# Patient Record
Sex: Female | Born: 1980 | Race: Black or African American | Hispanic: No | Marital: Single | Smoking: Never smoker
Health system: Southern US, Community
[De-identification: ages and names within clinical notes are randomized; demographics above are authoritative.]

## PROBLEM LIST (undated history)

## (undated) ENCOUNTER — Ambulatory Visit: Admission: EM | Payer: Non-veteran care

## (undated) HISTORY — PX: TUBAL LIGATION: SHX77

## (undated) HISTORY — PX: ABLATION: SHX5711

---

## 2015-01-27 DIAGNOSIS — J343 Hypertrophy of nasal turbinates: Secondary | ICD-10-CM | POA: Insufficient documentation

## 2015-01-27 DIAGNOSIS — Z91040 Latex allergy status: Secondary | ICD-10-CM | POA: Insufficient documentation

## 2015-01-27 DIAGNOSIS — J3489 Other specified disorders of nose and nasal sinuses: Secondary | ICD-10-CM | POA: Diagnosis present

## 2015-01-28 ENCOUNTER — Encounter (HOSPITAL_COMMUNITY): Payer: Self-pay | Admitting: Emergency Medicine

## 2015-01-28 ENCOUNTER — Emergency Department (HOSPITAL_COMMUNITY)
Admission: EM | Admit: 2015-01-28 | Discharge: 2015-01-28 | Disposition: A | Discharge status: Home / Self Care | Payer: Non-veteran care | Attending: Emergency Medicine | Admitting: Emergency Medicine

## 2015-01-28 DIAGNOSIS — J343 Hypertrophy of nasal turbinates: Secondary | ICD-10-CM

## 2015-01-28 NOTE — ED Notes (Signed)
Pt reports noticing feeling of something in R nares.  Airway patent, resp e/u.

## 2015-01-28 NOTE — ED Provider Notes (Signed)
CSN: [id number]     Arrival date & time 01/27/15  2345 History   First MD Initiated Contact with Patient 01/28/15 0013     Chief Complaint  Patient presents with  . Foreign Body in Nose     (Consider location/radiation/quality/duration/timing/severity/associated sxs/prior Treatment) HPI   Pt p/w concern for foreign body in her right nostril.  States she has noticed something in her right nostril for the past month, felt that she just needed to blow her nose to get it out.  Today she looked in her nose with a flashlight and noticed a piece of something "hanging down."  Described it as a "long piece of skin."  She attempted to pull it out or move it with a Qtip and by blowing her nose without success.  Denies fevers, increased nasal drainage, epistaxis, sinus pressure, headaches, sore throat, postnasal drip, any nasal trauma.  It is not painful.  She does have occasional clear drainage with blowing her nose.    History reviewed. No pertinent past medical history. Past Surgical History  Procedure Laterality Date  . Tubal ligation     No family history on file. Social History  Substance Use Topics  . Smoking status: Never Smoker   . Smokeless tobacco: None  . Alcohol Use: Yes     Comment: occassionally   OB History    No data available     Review of Systems  Constitutional: Negative for fever and chills.  HENT: Negative for congestion, dental problem, ear pain, facial swelling, mouth sores, nosebleeds, postnasal drip, rhinorrhea, sinus pressure, sneezing, sore throat and trouble swallowing.   Respiratory: Negative for cough, shortness of breath, wheezing and stridor.   Cardiovascular: Negative for chest pain.  Musculoskeletal: Negative for neck pain and neck stiffness.  Skin: Negative for wound.  Allergic/Immunologic: Negative for immunocompromised state.  Psychiatric/Behavioral: Negative for self-injury.      Allergies  Latex  Home Medications   Prior to Admission  medications   Not on File   BP 146/93 mmHg  Pulse 75  Temp(Src) 97.9 F (36.6 C) (Oral)  Resp 18  SpO2 100%  LMP 01/16/2015 (Approximate) Physical Exam  Constitutional: She appears well-developed and well-nourished. No distress.  HENT:  Head: Normocephalic and atraumatic.  Nose: No rhinorrhea, sinus tenderness, septal deviation or nasal septal hematoma. No epistaxis.  No foreign bodies. Right sinus exhibits no maxillary sinus tenderness and no frontal sinus tenderness. Left sinus exhibits no maxillary sinus tenderness and no frontal sinus tenderness.  Mouth/Throat: Oropharynx is clear and moist. No oropharyngeal exudate.  Right nasal mucosa with flap with appearance of nasal turbinate/mucosa that moves up and down with airflow through the nose.  It is not erythematous, edematous.  No tenderness within the nares.    Eyes: Conjunctivae are normal.  Neck: Neck supple.  Pulmonary/Chest: Effort normal.  Neurological: She is alert.  Skin: She is not diaphoretic.  Nursing note and vitals reviewed.   ED Course  Procedures (including critical care time) Labs Review Labs Reviewed - No data to display  Imaging Review No results found. I have personally reviewed and evaluated these images and lab results as part of my medical decision-making.   EKG Interpretation None      MDM   Final diagnoses:  Nasal turbinate hypertrophy    Afebrile, nontoxic patient with nasal turbinate hypertrophy vs skin flap on the lateral aspect of the right naris.  Does not appear infected.  No sick symptoms and no change in the sensation  she has had x 1 month.  Came to ED because she saw the skin flap and felt it was not normal.   I doubt any emergency medical condition regarding this flap that requires further evaluation or consultation with ENT tonight.  D/C home with outpatient ENT follow up.  Discussed result, findings, treatment, and follow up  with patient.  Pt given return precautions.  Pt verbalizes  understanding and agrees with plan.           [place], PA-C 01/28/15 1308  Tomasita Crumble, MD 01/28/15 [phone]

## 2015-01-28 NOTE — Discharge Instructions (Signed)
Read the information below.  You may return to the Emergency Department at any time for worsening condition or any new symptoms that concern you.  If you develop fevers, facial pain, uncontrolled nasal bleeding, return to the ER for a recheck.

## 2017-05-24 ENCOUNTER — Emergency Department (HOSPITAL_COMMUNITY)
Admission: EM | Admit: 2017-05-24 | Discharge: 2017-05-24 | Disposition: A | Discharge status: Home / Self Care | Payer: 59 | Attending: Emergency Medicine | Admitting: Emergency Medicine

## 2017-05-24 ENCOUNTER — Other Ambulatory Visit: Payer: Self-pay

## 2017-05-24 ENCOUNTER — Emergency Department (HOSPITAL_COMMUNITY): Payer: 59

## 2017-05-24 DIAGNOSIS — S0990XA Unspecified injury of head, initial encounter: Secondary | ICD-10-CM | POA: Diagnosis not present

## 2017-05-24 DIAGNOSIS — R51 Headache: Secondary | ICD-10-CM | POA: Diagnosis present

## 2017-05-24 DIAGNOSIS — F0781 Postconcussional syndrome: Secondary | ICD-10-CM | POA: Insufficient documentation

## 2017-05-24 DIAGNOSIS — H538 Other visual disturbances: Secondary | ICD-10-CM | POA: Diagnosis not present

## 2017-05-24 DIAGNOSIS — Y9289 Other specified places as the place of occurrence of the external cause: Secondary | ICD-10-CM | POA: Diagnosis not present

## 2017-05-24 DIAGNOSIS — G44309 Post-traumatic headache, unspecified, not intractable: Secondary | ICD-10-CM | POA: Diagnosis not present

## 2017-05-24 DIAGNOSIS — Y9389 Activity, other specified: Secondary | ICD-10-CM | POA: Diagnosis not present

## 2017-05-24 DIAGNOSIS — Y999 Unspecified external cause status: Secondary | ICD-10-CM | POA: Diagnosis not present

## 2017-05-24 DIAGNOSIS — R519 Headache, unspecified: Secondary | ICD-10-CM

## 2017-05-24 MED ORDER — ACETAMINOPHEN 500 MG PO TABS: 500.0000 mg | ORAL_TABLET | Freq: Four times a day (QID) | ORAL | 0 refills | Status: DC | PRN
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
## 2021-05-25 ENCOUNTER — Other Ambulatory Visit: Payer: Self-pay | Admitting: Obstetrics and Gynecology

## 2021-05-25 DIAGNOSIS — R928 Other abnormal and inconclusive findings on diagnostic imaging of breast: Secondary | ICD-10-CM

## 2021-07-12 ENCOUNTER — Emergency Department (HOSPITAL_COMMUNITY): Payer: BC Managed Care – PPO

## 2021-07-12 ENCOUNTER — Encounter (HOSPITAL_COMMUNITY): Payer: Self-pay | Admitting: Emergency Medicine

## 2021-07-12 ENCOUNTER — Other Ambulatory Visit: Payer: Self-pay

## 2021-07-12 ENCOUNTER — Emergency Department (HOSPITAL_COMMUNITY)
Admission: EM | Admit: 2021-07-12 | Discharge: 2021-07-12 | Disposition: A | Discharge status: Home / Self Care | Payer: BC Managed Care – PPO | Attending: Emergency Medicine | Admitting: Emergency Medicine

## 2021-07-12 DIAGNOSIS — R000 Tachycardia, unspecified: Secondary | ICD-10-CM | POA: Diagnosis not present

## 2021-07-12 DIAGNOSIS — Z91040 Latex allergy status: Secondary | ICD-10-CM | POA: Insufficient documentation

## 2021-07-12 DIAGNOSIS — N3001 Acute cystitis with hematuria: Secondary | ICD-10-CM | POA: Insufficient documentation

## 2021-07-12 DIAGNOSIS — Z79899 Other long term (current) drug therapy: Secondary | ICD-10-CM | POA: Insufficient documentation

## 2021-07-12 DIAGNOSIS — R1030 Lower abdominal pain, unspecified: Secondary | ICD-10-CM | POA: Insufficient documentation

## 2021-07-12 DIAGNOSIS — R350 Frequency of micturition: Secondary | ICD-10-CM | POA: Diagnosis present

## 2021-07-12 LAB — HEPATIC FUNCTION PANEL
ALT: 16 U/L (ref 0–44)
AST: 18 U/L (ref 15–41)
Albumin: 3.9 g/dL (ref 3.5–5.0)
Alkaline Phosphatase: 103 U/L (ref 38–126)
Bilirubin, Direct: <0.1 mg/dL (ref 0.0–0.2)
Total Bilirubin: 0.6 mg/dL (ref 0.3–1.2)
Total Protein: 8.5 g/dL — ABNORMAL HIGH (ref 6.5–8.1)

## 2021-07-12 LAB — CBC
HCT: 37 % (ref 36.0–46.0)
Hemoglobin: 11.6 g/dL — ABNORMAL LOW (ref 12.0–15.0)
MCH: 26.4 pg (ref 26.0–34.0)
MCHC: 31.4 g/dL (ref 30.0–36.0)
MCV: 84.1 fL (ref 80.0–100.0)
Platelets: 447 10*3/uL — ABNORMAL HIGH (ref 150–400)
RBC: 4.4 MIL/uL (ref 3.87–5.11)
RDW: 18.4 % — ABNORMAL HIGH (ref 11.5–15.5)
WBC: 7.6 10*3/uL (ref 4.0–10.5)
nRBC: 0 % (ref 0.0–0.2)

## 2021-07-12 LAB — URINALYSIS, ROUTINE W REFLEX MICROSCOPIC
Bilirubin Urine: NEGATIVE
Glucose, UA: NEGATIVE mg/dL
Ketones, ur: 20 mg/dL — AB
Nitrite: POSITIVE — AB
Protein, ur: 30 mg/dL — AB
Specific Gravity, Urine: 1.016 (ref 1.005–1.030)
WBC, UA: >50 WBC/hpf — ABNORMAL HIGH (ref 0–5)
pH: 6 (ref 5.0–8.0)

## 2021-07-12 LAB — I-STAT BETA HCG BLOOD, ED (MC, WL, AP ONLY): I-stat hCG, quantitative: <5 m[IU]/mL

## 2021-07-12 LAB — BASIC METABOLIC PANEL WITH GFR
Anion gap: 11 (ref 5–15)
BUN: 9 mg/dL (ref 6–20)
CO2: 24 mmol/L (ref 22–32)
Calcium: 9.3 mg/dL (ref 8.9–10.3)
Chloride: 102 mmol/L (ref 98–111)
Creatinine, Ser: 0.88 mg/dL (ref 0.44–1.00)
GFR, Estimated: >60 mL/min
Glucose, Bld: 134 mg/dL — ABNORMAL HIGH (ref 70–99)
Potassium: 3.3 mmol/L — ABNORMAL LOW (ref 3.5–5.1)
Sodium: 137 mmol/L (ref 135–145)

## 2021-07-12 LAB — LIPASE, BLOOD: Lipase: 31 U/L (ref 11–51)

## 2021-07-12 MED ORDER — OXYCODONE-ACETAMINOPHEN 5-325 MG PO TABS: 1.0000 | ORAL_TABLET | Freq: Four times a day (QID) | ORAL | 0 refills | Status: DC | PRN

## 2021-07-12 MED ORDER — CEPHALEXIN 250 MG PO CAPS
500.0000 mg | ORAL_CAPSULE | Freq: Once | ORAL | Status: AC
  Administered 2021-07-12: 500 mg via ORAL
  Filled 2021-07-12: qty 2

## 2021-07-12 MED ORDER — ONDANSETRON 4 MG PO TBDP: 4.0000 mg | ORAL_TABLET | Freq: Three times a day (TID) | ORAL | 0 refills | Status: DC | PRN

## 2021-07-12 MED ORDER — ONDANSETRON HCL 4 MG/2ML IJ SOLN
4.0000 mg | Freq: Once | INTRAMUSCULAR | Status: AC
  Administered 2021-07-12: 4 mg via INTRAVENOUS
  Filled 2021-07-12: qty 2

## 2021-07-12 MED ORDER — SODIUM CHLORIDE 0.9 % IV BOLUS
1000.0000 mL | Freq: Once | INTRAVENOUS | Status: AC
  Administered 2021-07-12: 1000 mL via INTRAVENOUS

## 2021-07-12 MED ORDER — HYDROCODONE-ACETAMINOPHEN 5-325 MG PO TABS
1.0000 | ORAL_TABLET | Freq: Once | ORAL | Status: AC
  Administered 2021-07-12: 1 via ORAL
  Filled 2021-07-12: qty 1

## 2021-07-12 MED ORDER — MORPHINE SULFATE (PF) 4 MG/ML IV SOLN
4.0000 mg | Freq: Once | INTRAVENOUS | Status: AC
  Administered 2021-07-12: 4 mg via INTRAVENOUS
  Filled 2021-07-12: qty 1

## 2021-07-12 MED ORDER — POTASSIUM CHLORIDE CRYS ER 20 MEQ PO TBCR
40.0000 meq | EXTENDED_RELEASE_TABLET | Freq: Once | ORAL | Status: AC
  Administered 2021-07-12: 40 meq via ORAL
  Filled 2021-07-12: qty 2

## 2021-07-12 MED ORDER — CEPHALEXIN 500 MG PO CAPS: 500.0000 mg | ORAL_CAPSULE | Freq: Three times a day (TID) | ORAL | 0 refills | Status: AC

## 2021-07-12 MED ORDER — IOHEXOL 300 MG/ML  SOLN
100.0000 mL | Freq: Once | INTRAMUSCULAR | Status: AC | PRN
  Administered 2021-07-12: 100 mL via INTRAVENOUS

## 2021-07-12 NOTE — ED Provider Notes (Signed)
[facility] EMERGENCY DEPARTMENT Provider Note   CSN: [id number] Arrival date & time: 07/12/21  1811     History  Chief Complaint  Patient presents with   Flank Pain    Diana Solomon is a 41 y.o. female.  41 year old female presents today for evaluation of 1 week duration of abdominal pain, bilateral flank pain, urinary frequency.  She denies dysuria, fever, nausea, vomiting.  She does endorse lack of appetite.  States she has been able to tolerate p.o. intake without difficulty.  She has not been hydrating adequately.  She is sexually active with 1 female partner.  However she denies dyspareunia, vaginal discharge, vaginal bleeding.  Denies prior history of nephrolithiasis.  The history is provided by the patient. No language interpreter was used.      Home Medications Prior to Admission medications   Medication Sig Start Date End Date Taking? Authorizing Provider  acetaminophen (TYLENOL) 500 MG tablet Take 1 tablet (500 mg total) by mouth every 6 (six) hours as needed for headache. 05/24/17   Waynetta Pean, PA-C      Allergies    Latex    Review of Systems   Review of Systems  Constitutional:  Negative for chills and fever.  Respiratory:  Negative for shortness of breath.   Cardiovascular:  Negative for chest pain.  Gastrointestinal:  Positive for abdominal pain. Negative for nausea and vomiting.  Genitourinary:  Positive for frequency. Negative for dyspareunia, dysuria and pelvic pain.  Neurological:  Negative for weakness and light-headedness.  All other systems reviewed and are negative.  Physical Exam Updated Vital Signs BP 138/88 (BP Location: Right Arm)    Pulse (!) 109    Temp 98.6 F (37 C) (Oral)    Resp 18    SpO2 97%  Physical Exam Vitals and nursing note reviewed.  Constitutional:      General: She is not in acute distress.    Appearance: Normal appearance. She is not ill-appearing.  HENT:     Head: Normocephalic and atraumatic.      Nose: Nose normal.  Eyes:     Conjunctiva/sclera: Conjunctivae normal.  Cardiovascular:     Rate and Rhythm: Regular rhythm. Tachycardia present.  Pulmonary:     Effort: Pulmonary effort is normal. No respiratory distress.     Breath sounds: Normal breath sounds. No wheezing.  Abdominal:     General: There is no distension.     Palpations: Abdomen is soft.     Tenderness: There is abdominal tenderness (Tenderness present to the left lower quadrant, right lower quadrant.  Worse on right lower quadrant.). There is right CVA tenderness and left CVA tenderness. There is no guarding or rebound.  Musculoskeletal:        General: No deformity.  Skin:    Findings: No rash.  Neurological:     Mental Status: She is alert.    ED Results / Procedures / Treatments   Labs (all labs ordered are listed, but only abnormal results are displayed) Labs Reviewed  URINALYSIS, ROUTINE W REFLEX MICROSCOPIC  BASIC METABOLIC PANEL  CBC  LIPASE, BLOOD  HEPATIC FUNCTION PANEL  I-STAT BETA HCG BLOOD, ED ([facility], [facility], [facility] ONLY)    EKG None  Radiology No results found.  Procedures Procedures    Medications Ordered in ED Medications  morphine (PF) 4 MG/ML injection 4 mg (has no administration in time range)  ondansetron (ZOFRAN) injection 4 mg (has no administration in time range)  sodium chloride 0.9 %  bolus 1,000 mL (has no administration in time range)    ED Course/ Medical Decision Making/ A&P                           Medical Decision Making Amount and/or Complexity of Data Reviewed Labs: ordered. Radiology: ordered.  Risk Prescription drug management.   Medical Decision Making / ED Course   This patient presents to the ED for concern of abdominal pain, urinary frequency, this involves an extensive number of treatment options, and is a complaint that carries with it a high risk of complications and morbidity.  The differential diagnosis includes UTI, pyelonephritis, nephrolithiasis,  appendicitis, PID  MDM: 41 year old female presents today for evaluation of lower abdominal pain, urinary frequency, flank pain of 1 week duration.  She denies fever, vaginal discharge, vaginal bleeding.  She does endorse decreased p.o. intake.  We will provide IV hydration, pain medication, and evaluate with CBC, CMP, UA, CT abdomen and pelvis.  CBC without leukocytosis, mild anemia at 11.6.  BMP with potassium of 3.3 otherwise without acute findings.  Hepatic function panel grossly unremarkable.  Lipase within normal limits.  UA with presence of leukocytes, nitrite positive, greater than 50 WBC.  CT abdomen pelvis without evidence of appendicitis, cholecystitis, ureterolithiasis.  She does have a nonobstructing kidney stone.  Likely not contributing to presenting symptoms.  PID is a concern.  She is sexually active only with 1 female partner.  However she denies vaginal discharge, fever, or pelvic pain.  Pelvic exam and STI testing offered however she refuses.  States if her symptoms worsen she will return or follow-up with PCP.  She states she recently had STI testing in December and she has not changed her sexual behavior or partner since then.  CT does show diverticulosis but without evidence of inflammation or diverticulitis.  Will provide patient with Keflex.  Will replete potassium.  Symptomatic management discussed.  Return precautions discussed.  Patient voices understanding and is in agreement with plan.   Lab Tests: -I ordered, reviewed, and interpreted labs.   The pertinent results include:   Labs Reviewed  URINALYSIS, ROUTINE W REFLEX MICROSCOPIC - Abnormal; Notable for the following components:      Result Value   APPearance HAZY (*)    Hgb urine dipstick MODERATE (*)    Ketones, ur 20 (*)    Protein, ur 30 (*)    Nitrite POSITIVE (*)    Leukocytes,Ua SMALL (*)    WBC, UA >50 (*)    Bacteria, UA FEW (*)    All other components within normal limits  BASIC METABOLIC PANEL -  Abnormal; Notable for the following components:   Potassium 3.3 (*)    Glucose, Bld 134 (*)    All other components within normal limits  CBC - Abnormal; Notable for the following components:   Hemoglobin 11.6 (*)    RDW 18.4 (*)    Platelets 447 (*)    All other components within normal limits  HEPATIC FUNCTION PANEL - Abnormal; Notable for the following components:   Total Protein 8.5 (*)    All other components within normal limits  URINE CULTURE  LIPASE, BLOOD  I-STAT BETA HCG BLOOD, ED ([facility], [facility], [facility] ONLY)      EKG  EKG Interpretation  Date/Time:    Ventricular Rate:    PR Interval:    QRS Duration:   QT Interval:    QTC Calculation:   R Axis:  Text Interpretation:           Imaging Studies ordered: I ordered imaging studies including CT abdomen and pelvis I independently visualized and interpreted imaging. I agree with the radiologist interpretation   Medicines ordered and prescription drug management: Meds ordered this encounter  Medications   morphine (PF) 4 MG/ML injection 4 mg   ondansetron (ZOFRAN) injection 4 mg   sodium chloride 0.9 % bolus 1,000 mL   iohexol (OMNIPAQUE) 300 MG/ML solution 100 mL   potassium chloride SA (KLOR-CON M) CR tablet 40 mEq   cephALEXin (KEFLEX) capsule 500 mg   cephALEXin (KEFLEX) 500 MG capsule    Sig: Take 1 capsule (500 mg total) by mouth 3 (three) times daily for 7 days.    Dispense:  21 capsule    Refill:  0    Order Specific Question:   Supervising Provider    Answer:   MILLER, BRIAN [[id number]]   ondansetron (ZOFRAN-ODT) 4 MG disintegrating tablet    Sig: Take 1 tablet (4 mg total) by mouth every 8 (eight) hours as needed for nausea or vomiting.    Dispense:  20 tablet    Refill:  0    Order Specific Question:   Supervising Provider    Answer:   Sabra Heck, BRIAN [id number]   oxyCODONE-acetaminophen (PERCOCET/ROXICET) 5-325 MG tablet    Sig: Take 1 tablet by mouth every 6 (six) hours as needed for severe pain.     Dispense:  10 tablet    Refill:  0    Order Specific Question:   Supervising Provider    Answer:   Sabra Heck, BRIAN [[id number]]   HYDROcodone-acetaminophen (NORCO/VICODIN) 5-325 MG per tablet 1 tablet    -I have reviewed the patients home medicines and have made adjustments as needed  Cardiac Monitoring: The patient was maintained on a cardiac monitor.  I personally viewed and interpreted the cardiac monitored which showed an underlying rhythm of: Initially sinus tachycardia improved to normal sinus rhythm following IV hydration  Reevaluation: After the interventions noted above, I reevaluated the patient and found that they have :improved  Co morbidities that complicate the patient evaluation History reviewed. No pertinent past medical history.    Dispostion: Patient is appropriate for discharge.  Discharged in stable condition.  Return precautions discussed.   Final Clinical Impression(s) / ED Diagnoses Final diagnoses:  Acute cystitis with hematuria  Lower abdominal pain    Rx / DC Orders ED Discharge Orders          Ordered    cephALEXin (KEFLEX) 500 MG capsule  3 times daily        07/12/21 2005    ondansetron (ZOFRAN-ODT) 4 MG disintegrating tablet  Every 8 hours PRN        07/12/21 2005    oxyCODONE-acetaminophen (PERCOCET/ROXICET) 5-325 MG tablet  Every 6 hours PRN        07/12/21 2005              Evlyn Courier, PA-C 07/12/21 2034    Hayden Rasmussen, MD 07/13/21 1003

## 2021-07-12 NOTE — Discharge Instructions (Signed)
Your work-up today showed UTI.  Blood work was otherwise reassuring.  He had slightly low potassium.  You received a potassium supplement in the emergency room.  You can have this rechecked in a couple weeks by your primary care provider.  Your CT scan did not show any concern for infection or inflammation.  I have sent antibiotics to the pharmacy for you.  Along with a few doses of pain medication and nausea medicine to keep on hand for severe pain or any nausea you may develop.  If you have worsening symptoms, or new symptoms such as vaginal discharge, fever, worsening abdominal pain please return to the emergency room.  We did discuss doing a pelvic exam along with checking for STI however you state you recently had this test prior to your surgery and if you have worsening symptoms you will either return or follow-up with your primary care provider for evaluation.

## 2021-07-12 NOTE — ED Triage Notes (Signed)
Reports lower back/flank pain, nausea, and frequent urination x 1 week.  Also has a metallic taste in her mouth.

## 2021-07-15 LAB — URINE CULTURE: Culture: >=100000 — AB

## 2021-07-16 ENCOUNTER — Telehealth: Payer: Self-pay | Admitting: *Deleted

## 2021-07-16 NOTE — Telephone Encounter (Signed)
Post ED Visit - Positive Culture Follow-up ? ?Culture report reviewed by antimicrobial stewardship pharmacist: ?[facility] ?[]  , Pharm.D. ?[]  Enzo Bi, Pharm.D., BCPS AQ-ID ?[]  , Pharm.D., BCPS ?[]  Celedonio Miyamoto, Pharm.D., BCPS ?[]  [place], Garvin Fila.D., BCPS, AAHIVP ?[]  , Pharm.D., BCPS, AAHIVP ?[]  Georgina Pillion, PharmD, BCPS ?[]  , PharmD, BCPS ?[]  [place], PharmD, BCPS ?[]  [address], PharmD ?[]  , PharmD, BCPS ?[x]  Estella Husk, PharmD ? ? Pharmacy Team ?[]  Lysle Pearl, PharmD ?[]  , PharmD ?[]  Phillips Climes, PharmD ?[]  , Rph ?[]  Agapito Games) , PharmD ?[]  Verlan Friends, PharmD ?[]  , PharmD ?[]  Mervyn Gay, PharmD ?[]  , PharmD ?[]  Vinnie Level, PharmD ?[]  [facility], PharmD ?[]  , PharmD ?[]  Len Childs, PharmD ? ? ?Positive urine culture ?Treated with Cephalexin, organism sensitive to the same and no further patient follow-up is required at this time. ? ? ?07/16/2021, 9:49 AM ?  ?

## 2021-07-31 ENCOUNTER — Other Ambulatory Visit: Payer: Non-veteran care

## 2021-08-26 ENCOUNTER — Ambulatory Visit: Payer: Non-veteran care

## 2021-08-26 ENCOUNTER — Ambulatory Visit
Admission: RE | Admit: 2021-08-26 | Discharge: 2021-08-26 | Disposition: A | Discharge status: Home / Self Care | Payer: BC Managed Care – PPO | Source: Ambulatory Visit | Attending: Obstetrics and Gynecology | Admitting: Obstetrics and Gynecology

## 2021-08-26 ENCOUNTER — Other Ambulatory Visit: Payer: Self-pay | Admitting: Obstetrics and Gynecology

## 2021-08-26 DIAGNOSIS — R921 Mammographic calcification found on diagnostic imaging of breast: Secondary | ICD-10-CM

## 2021-08-26 DIAGNOSIS — R928 Other abnormal and inconclusive findings on diagnostic imaging of breast: Secondary | ICD-10-CM

## 2022-10-28 ENCOUNTER — Other Ambulatory Visit: Payer: Self-pay

## 2022-10-28 ENCOUNTER — Emergency Department (HOSPITAL_COMMUNITY): Payer: No Typology Code available for payment source

## 2022-10-28 ENCOUNTER — Encounter (HOSPITAL_COMMUNITY): Payer: Self-pay

## 2022-10-28 ENCOUNTER — Emergency Department (HOSPITAL_COMMUNITY)
Admission: EM | Admit: 2022-10-28 | Discharge: 2022-10-29 | Disposition: A | Discharge status: Home / Self Care | Payer: No Typology Code available for payment source | Attending: Emergency Medicine | Admitting: Emergency Medicine

## 2022-10-28 DIAGNOSIS — M25531 Pain in right wrist: Secondary | ICD-10-CM | POA: Diagnosis present

## 2022-10-28 DIAGNOSIS — S52124A Nondisplaced fracture of head of right radius, initial encounter for closed fracture: Secondary | ICD-10-CM

## 2022-10-28 DIAGNOSIS — S52501A Unspecified fracture of the lower end of right radius, initial encounter for closed fracture: Secondary | ICD-10-CM | POA: Diagnosis not present

## 2022-10-28 DIAGNOSIS — Y92410 Unspecified street and highway as the place of occurrence of the external cause: Secondary | ICD-10-CM | POA: Diagnosis not present

## 2022-10-28 MED ORDER — MORPHINE SULFATE (PF) 4 MG/ML IV SOLN
4.0000 mg | Freq: Once | INTRAVENOUS | Status: AC
  Administered 2022-10-28: 4 mg via INTRAVENOUS
  Filled 2022-10-28: qty 1

## 2022-10-28 MED ORDER — HYDROCODONE-ACETAMINOPHEN 5-325 MG PO TABS: 2.0000 | ORAL_TABLET | ORAL | 0 refills | Status: AC | PRN
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
## 2022-10-28 NOTE — Progress Notes (Signed)
Orthopedic Tech Progress Note Patient Details:  Diana Solomon [birth year] [id number] PT was in a lot of pain during application it was hard for her to stay still applied splint to the best for my ability.  Ortho Devices Type of Ortho Device: Sugartong splint Ortho Device/Splint Location: LUE Ortho Device/Splint Interventions: Ordered, Application, Adjustment   Post Interventions Patient Tolerated: [name] 10/28/2022, 10:51 PM
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
## 2022-10-28 NOTE — Discharge Instructions (Signed)
Diana Solomon:  Thank you for allowing [place] to take care of you today.  We hope you begin feeling better soon.  To-Do: Please follow-up with hand surgery.  Call the number provided to set up an appointment.   Take Tylenol 650 mg every 6 hours for pain.  You can add ibuprofen 400 mg every 6 hours for pain.  If this is insufficient take Norco 1 tablet as needed every 6 hours for severe pain.  This medication has some Tylenol in it so do not take more than 650 mg of scheduled Tylenol at a time. Please return to the Emergency Department or call 911 if you experience chest pain, shortness of breath, severe pain, severe fever, altered mental status, or have any reason to think that you need emergency medical care.  Thank you again.  Hope you feel better soon.  Department of Emergency Medicine [facility]

## 2022-10-29 ENCOUNTER — Telehealth: Payer: Self-pay

## 2022-10-29 NOTE — Telephone Encounter (Signed)
This RNCM received call from patient requesting a referral for ortho appt due to patient called and was told there was no referral on file.   This RNCM spoke with [facility] [phone] to advise of recommendation from EDP during ED visit. Patient has a follow up appointment on Wednesday 11/03/22 at 9:50am.  - 4:20pm This RNCM left voicemail for patient of appointment date and time, encouraged to bring insurance card and AVS to her appointment.    No additional TOC needs.

## 2022-11-03 ENCOUNTER — Other Ambulatory Visit: Payer: Self-pay

## 2022-11-03 ENCOUNTER — Encounter (HOSPITAL_BASED_OUTPATIENT_CLINIC_OR_DEPARTMENT_OTHER): Payer: Self-pay | Admitting: Orthopedic Surgery

## 2022-11-03 NOTE — Progress Notes (Signed)
   11/03/22 1044  Pre-op Phone Call  Surgery Date Verified 11/09/22  Arrival Time Verified 0715  Surgery Location Verified [facility]  Medical History Reviewed Yes  Is the patient taking a GLP-1 receptor agonist? No  Do you have a history of heart problems? No  Antiarrhythmic device type  (NA)  Does patient have other implanted devices? No  Patient Teaching Pain Control;Pre-op CHG Bathing  Patient educated about smoking cessation 24 hours prior to surgery. N/A Non-Smoker  Patient verbalizes understanding of bowel prep? N/A  Med Rec Completed Yes  Take the Following Meds the Morning of Surgery no meds DOS, unless pain med needed and then only with sip water  Recent  Lab Work, EKG, CXR? No  NPO (Including gum & candy) After midnight  Stop Solids, Milk, Candy, and Gum STARTING AT MIDNIGHT  Responsible adult to drive and be with you for 24 hours? Yes  Name & Phone Number for Ride/Caregiver mom, Annia Friendly  No Jewelry, money, nail polish or make-up.  No lotions, powders, perfumes. No shaving  48 hrs. prior to surgery. Yes  Contacts, Dentures & Glasses Will Have to be Removed Before OR. Yes  Please bring your ID and Insurance Card the morning of your surgery. (Surgery Centers Only) Yes  Bring any papers or x-rays with you that your surgeon gave you. Yes  Instructed to contact the location of procedure/ provider if they or anyone in their household develops symptoms or tests positive for COVID-19, has close contact with someone who tests positive for COVID, or has known exposure to any contagious illness. Yes  Call this number the morning of surgery  with any problems that may cancel your surgery. [phone]  Covid-19 Assessment  Have you had a positive COVID-19 test within the previous 90 days? No  COVID Testing Guidance Proceed with the additional questions.  Patient's surgery required a COVID-19 test (cardiothoracic, complex ENT, and bronchoscopies/ EBUS) No  Have you been unmasked and in close  contact with anyone with COVID-19 or COVID-19 symptoms within the past 10 days? No  Do you or anyone in your household currently have any COVID-19 symptoms? No

## 2022-11-05 NOTE — H&P (Signed)
Preoperative History & Physical Exam  Surgeon: Philipp Ovens, MD  Diagnosis: Right wrist fracture, right radial head fracture  Planned Procedure: Open reduction internal fixation of right wrist fracture, possible open reduction internal fixation of right radial head fracture  History of Present Illness:   Patient is a 42 y.o. female with symptoms consistent with right wrist fracture and right radial head fracture who presents for surgical intervention. The risks, benefits and alternatives of surgical intervention were discussed and informed consent was obtained prior to surgery.  Past Medical History: History reviewed. No pertinent past medical history.  Past Surgical History:  Past Surgical History:  Procedure Laterality Date   ABLATION     TUBAL LIGATION      Medications:  Prior to Admission medications   Medication Sig Start Date End Date Taking? Authorizing Provider  HYDROcodone-acetaminophen (NORCO/VICODIN) 5-325 MG tablet Take 2 tablets by mouth every 4 (four) hours as needed. 10/28/22  Yes Chase Caller, MD  aspirin-acetaminophen-caffeine (EXCEDRIN EXTRA STRENGTH) [phone] MG tablet Take 1 tablet by mouth every 6 (six) hours as needed for headache.    [provider]    Allergies:  Latex  Review of Systems: Negative except per HPI.  Physical Exam: Alert and oriented, NAD Head and neck: no masses, normal alignment CV: pulse intact Pulm: no increased work of breathing, respirations even and unlabored Abdomen: non-distended Extremities: extremities warm and well perfused  LABS: No results found for this or any previous visit (from the past 2160 hour(s)).   Complete History and Physical exam available in the office notes  Nolberto Hanlon Elzora Cullins

## 2022-11-08 ENCOUNTER — Encounter (HOSPITAL_COMMUNITY): Payer: Self-pay | Admitting: Certified Registered"

## 2022-11-09 ENCOUNTER — Ambulatory Visit (HOSPITAL_BASED_OUTPATIENT_CLINIC_OR_DEPARTMENT_OTHER)
Admission: RE | Admit: 2022-11-09 | Payer: No Typology Code available for payment source | Source: Home / Self Care | Admitting: Orthopedic Surgery

## 2022-11-09 DIAGNOSIS — Z419 Encounter for procedure for purposes other than remedying health state, unspecified: Secondary | ICD-10-CM

## 2022-11-09 SURGERY — OPEN REDUCTION INTERNAL FIXATION (ORIF) WRIST FRACTURE
Anesthesia: Regional | Site: Wrist | Laterality: Right

## 2023-05-18 IMAGING — MG DIGITAL DIAGNOSTIC BILAT W/ TOMO W/ CAD
8 of 10 series · 8 of 22 positions shown · non-contrast
Comparison: 05/20/2021

CLINICAL DATA: Patient returns after baseline screening study for
evaluation of possible RIGHT breast asymmetry and LEFT breast
calcifications.

EXAM:
DIGITAL DIAGNOSTIC BILATERAL MAMMOGRAM WITH TOMOSYNTHESIS AND CAD
TECHNIQUE: Bilateral digital diagnostic mammography and breast tomosynthesis
was performed. The images were evaluated with computer-aided
detection.

[L CC (1 of 2)]
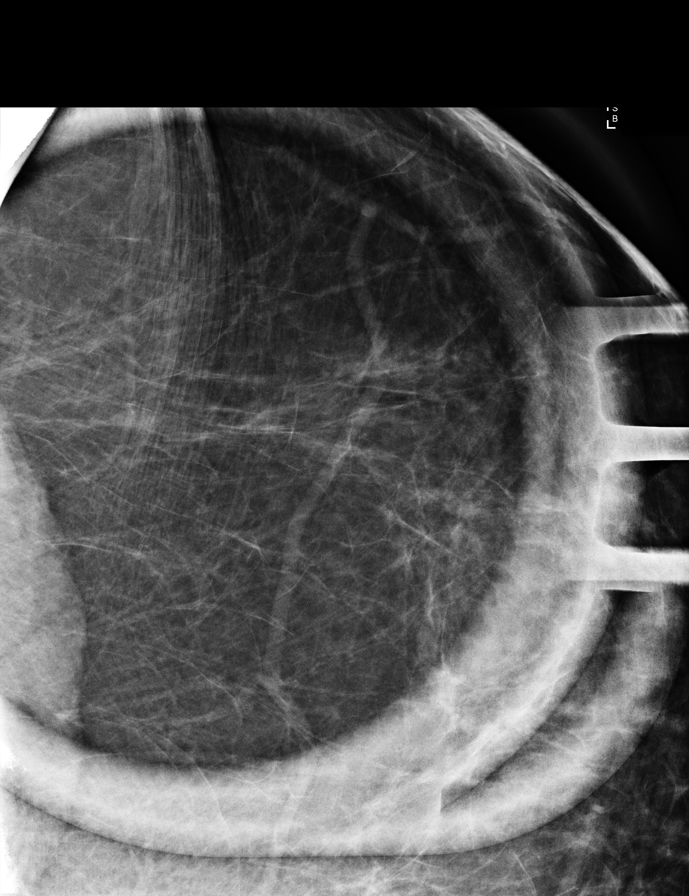

[L ML (1 of 2)]
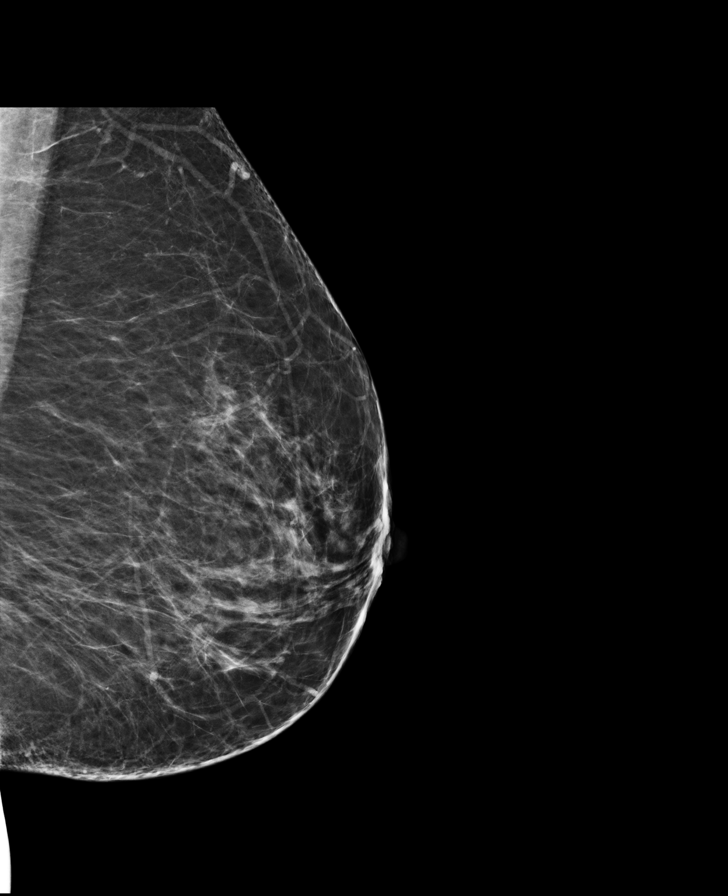

[L ML (2 of 2)]
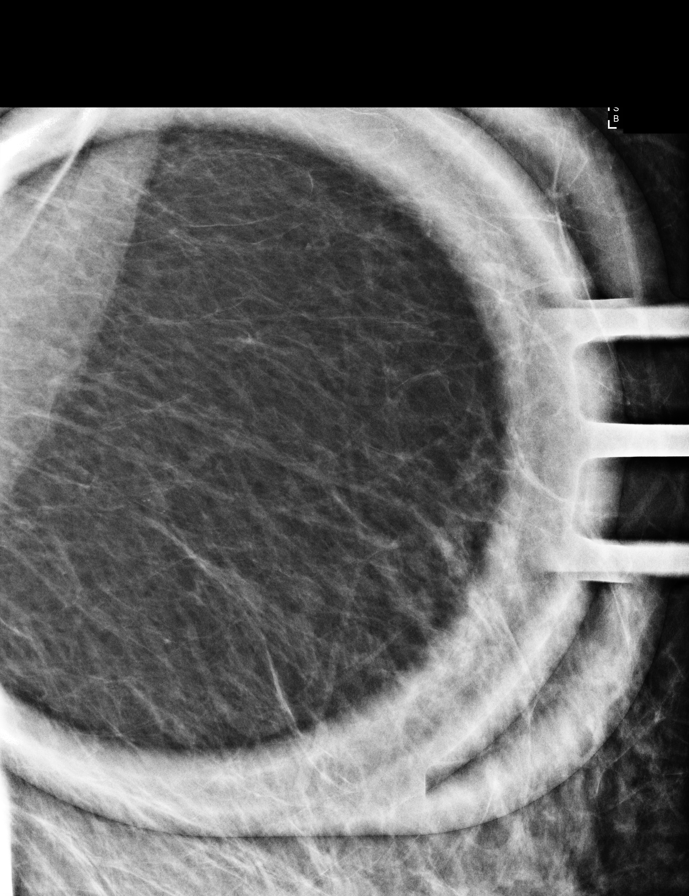

[L CC (2 of 2)]
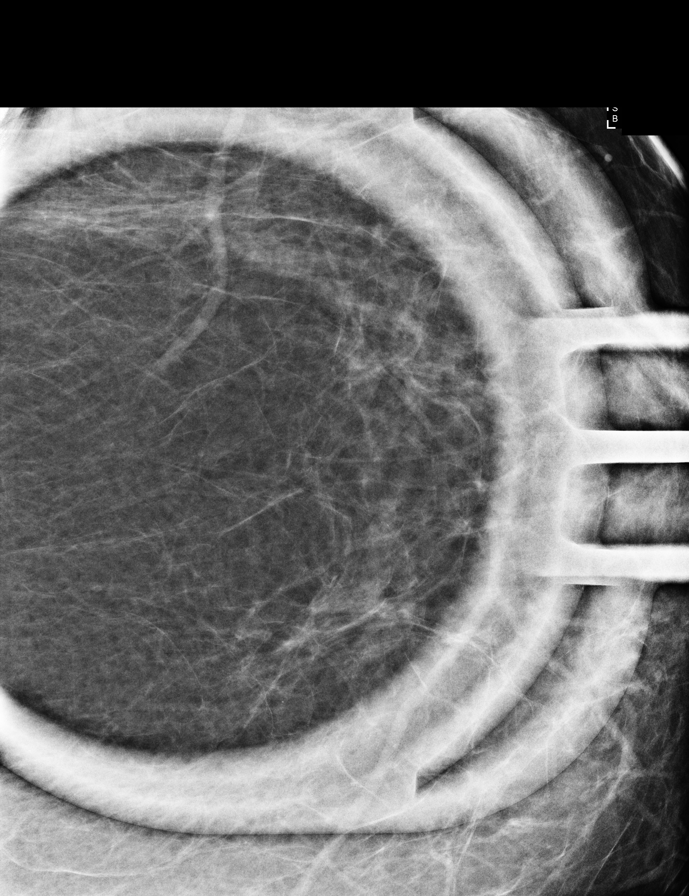

[R MLO synth-2D]
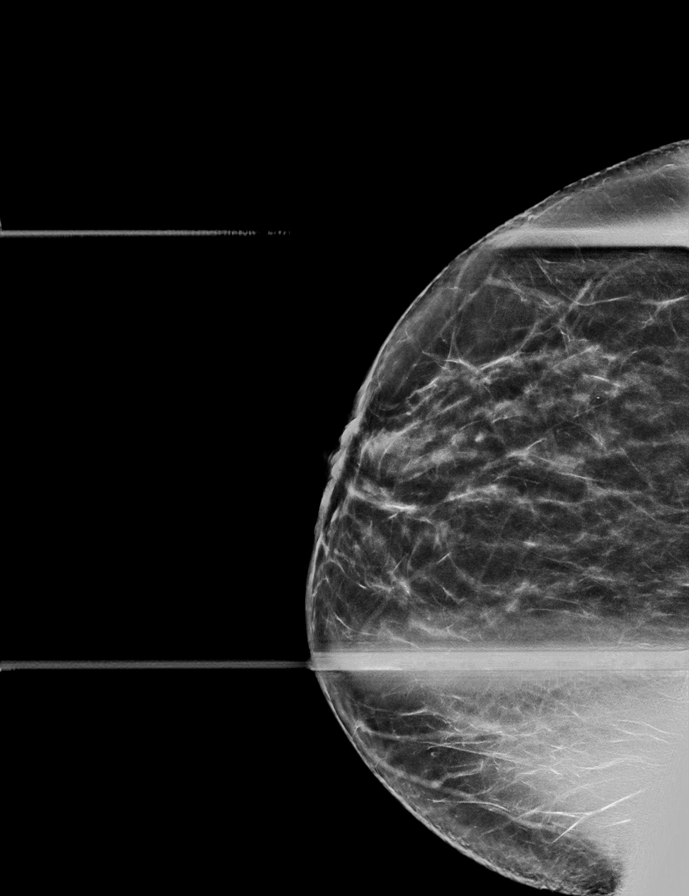

[R ML synth-2D]
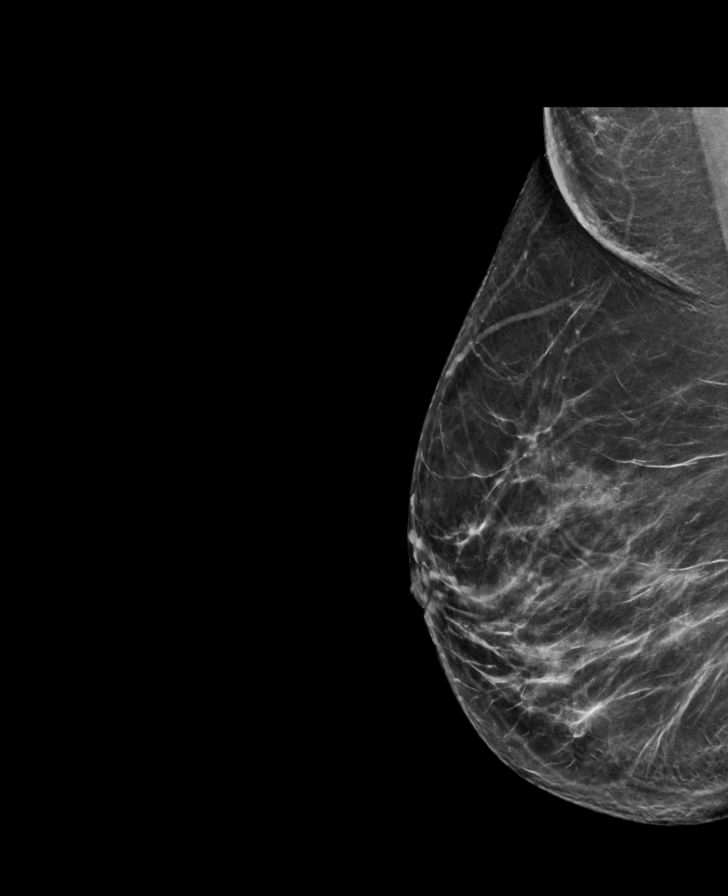

[R CC synth-2D]
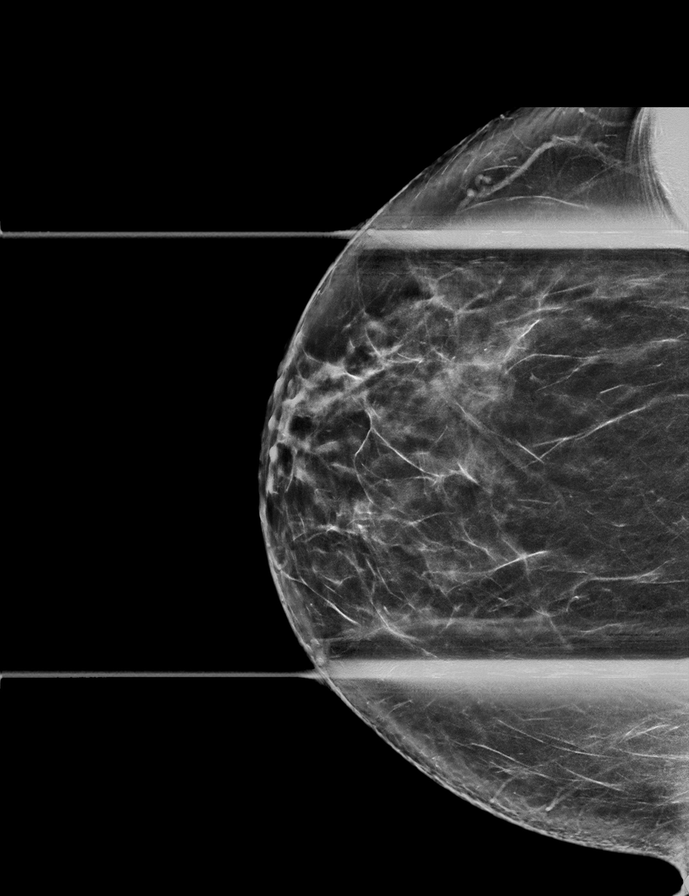

[R CC tomo · tomo slice 37/74.0]
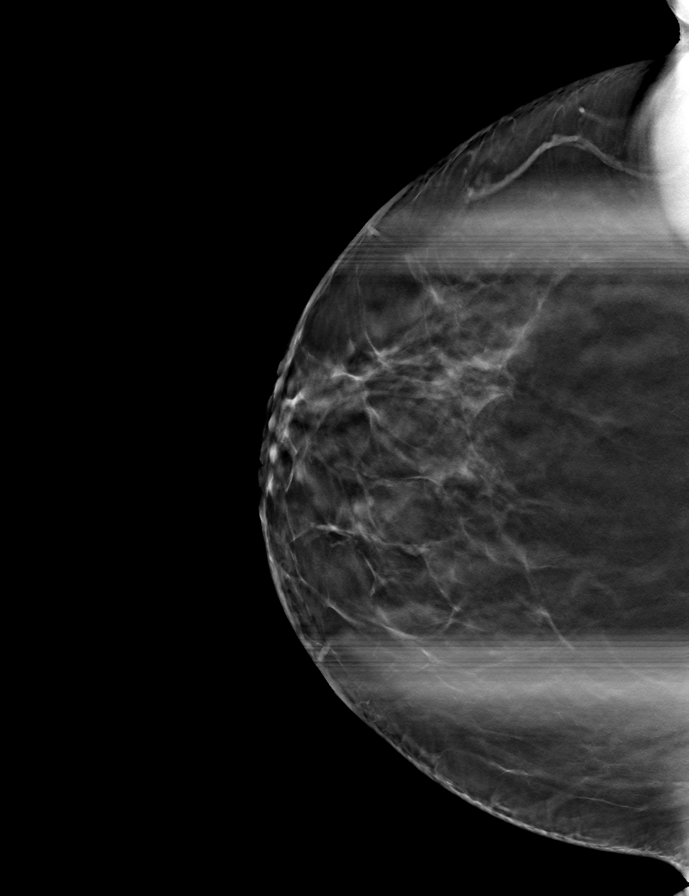

[8 of 22 positions shown; findings below may reference images not displayed]

ACR Breast Density Category b: There are scattered areas of
fibroglandular density.
FINDINGS: RIGHT BREAST:

Mammogram: Additional 2-D and 3-D images are performed. These views
show no persistent asymmetry in the LATERAL or MEDIAL anterior
aspects of the RIGHT breast. Mammographic images were processed with
CAD.

LEFT BREAST:

Mammogram: Magnified views are performed of calcifications in the
UPPER-OUTER QUADRANT of the LEFT breast. These views demonstrate
faint layering calcifications spanning 0.4 centimeters. No
associated parenchymal density or distortion. Mammographic images
were processed with CAD.
IMPRESSION: 1. No persistent abnormality in the RIGHT breast.
2. Probably benign LEFT breast calcifications.

RECOMMENDATION:
Recommend LEFT diagnostic mammogram in 6 months.

I have discussed the findings and recommendations with the patient.
If applicable, a reminder letter will be sent to the patient
regarding the next appointment.

BI-RADS CATEGORY  3: Probably benign.
